# Patient Record
Sex: Male | Born: 1985 | Hispanic: Yes | Marital: Single | Smoking: Never smoker
Health system: Southern US, Community
[De-identification: ages and names within clinical notes are randomized; demographics above are authoritative.]

---

## 2011-05-15 ENCOUNTER — Other Ambulatory Visit: Payer: Self-pay | Admitting: Internal Medicine

## 2011-05-15 DIAGNOSIS — M545 Low back pain: Secondary | ICD-10-CM

## 2011-05-18 ENCOUNTER — Other Ambulatory Visit: Payer: Self-pay

## 2013-02-14 ENCOUNTER — Emergency Department (HOSPITAL_COMMUNITY)
Admission: EM | Admit: 2013-02-14 | Discharge: 2013-02-15 | Disposition: A | Discharge status: Home / Self Care | Payer: Self-pay | Attending: Emergency Medicine | Admitting: Emergency Medicine

## 2013-02-14 ENCOUNTER — Emergency Department (HOSPITAL_COMMUNITY): Payer: Self-pay

## 2013-02-14 ENCOUNTER — Encounter (HOSPITAL_COMMUNITY): Payer: Self-pay | Admitting: *Deleted

## 2013-02-14 DIAGNOSIS — S01501A Unspecified open wound of lip, initial encounter: Secondary | ICD-10-CM | POA: Insufficient documentation

## 2013-02-14 DIAGNOSIS — Z23 Encounter for immunization: Secondary | ICD-10-CM | POA: Insufficient documentation

## 2013-02-14 DIAGNOSIS — IMO0002 Reserved for concepts with insufficient information to code with codable children: Secondary | ICD-10-CM | POA: Insufficient documentation

## 2013-02-14 LAB — URINALYSIS, ROUTINE W REFLEX MICROSCOPIC
Glucose, UA: NEGATIVE mg/dL
Hgb urine dipstick: NEGATIVE
Leukocytes, UA: NEGATIVE
Specific Gravity, Urine: 1.029 (ref 1.005–1.030)
pH: 5.5 (ref 5.0–8.0)

## 2013-02-14 LAB — URINE MICROSCOPIC-ADD ON

## 2013-02-14 MED ORDER — TETANUS-DIPHTH-ACELL PERTUSSIS 5-2.5-18.5 LF-MCG/0.5 IM SUSP
0.5000 mL | Freq: Once | INTRAMUSCULAR | Status: AC
  Administered 2013-02-14: 0.5 mL via INTRAMUSCULAR
  Filled 2013-02-14: qty 0.5
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
## 2013-02-15 NOTE — ED Notes (Signed)
Pt dc to home.  Pt ambulatory to exit without difficulty.  Pt denies need for w/c.  

## 2013-02-15 NOTE — ED Provider Notes (Signed)
Medical screening examination/treatment/procedure(s) were performed by non-physician practitioner and as supervising physician I was immediately available for consultation/collaboration.  Willis Kuipers, MD 02/15/13 1502 

## 2013-02-20 ENCOUNTER — Encounter (HOSPITAL_COMMUNITY): Payer: Self-pay | Admitting: Cardiology

## 2013-02-20 ENCOUNTER — Emergency Department (HOSPITAL_COMMUNITY)
Admission: EM | Admit: 2013-02-20 | Discharge: 2013-02-20 | Disposition: A | Discharge status: Home / Self Care | Payer: Self-pay | Attending: Emergency Medicine | Admitting: Emergency Medicine

## 2013-02-20 DIAGNOSIS — Z4802 Encounter for removal of sutures: Secondary | ICD-10-CM | POA: Insufficient documentation

## 2013-02-20 NOTE — ED Provider Notes (Signed)
CSN: [id number]     Arrival date & time 02/20/13  0844 History   First MD Initiated Contact with Patient 02/20/13 [phone]     Chief Complaint  Patient presents with  . Suture / Staple Removal   (Consider location/radiation/quality/duration/timing/severity/associated sxs/prior Treatment) HPI Comments: 27 year old Hispanic male presents to the emergency department for suture removal. Patient had 5 sutures placed 6 days ago, 4 in his upper lip, one in his lower lip. Denies having any problems with the sutures. Denies redness, swelling or discharge. No fever or chills.  Patient is a 27 y.o. male presenting with suture removal. The history is provided by the patient.  Suture / Staple Removal    History reviewed. No pertinent past medical history. History reviewed. No pertinent past surgical history. History reviewed. No pertinent family history. History  Substance Use Topics  . Smoking status: Never Smoker   . Smokeless tobacco: Not on file  . Alcohol Use: Not on file    Review of Systems  Skin: Positive for wound.  All other systems reviewed and are negative.    Allergies  Review of patient's allergies indicates no known allergies.  Home Medications   Current Outpatient Rx  Name  Route  Sig  Dispense  Refill  . acetaminophen (TYLENOL) 500 MG tablet   Oral   Take 500 mg by mouth every 8 (eight) hours as needed for pain.          BP 118/68  Pulse 67  Temp(Src) 97.9 F (36.6 C) (Oral)  Resp 18  SpO2 97% Physical Exam  Nursing note and vitals reviewed. Constitutional: He is oriented to person, place, and time. He appears well-developed and well-nourished. No distress.  HENT:  Head: Normocephalic and atraumatic.  Mouth/Throat: Oropharynx is clear and moist.  Well-healed laceration to the upper and lower lip. 5 Sutures in place. No signs of infection.  Eyes: Conjunctivae are normal.  Neck: Normal range of motion. Neck supple.  Cardiovascular: Normal rate, regular rhythm  and normal heart sounds.   Pulmonary/Chest: Effort normal and breath sounds normal.  Musculoskeletal: Normal range of motion. He exhibits no edema.  Neurological: He is alert and oriented to person, place, and time.  Skin: Skin is warm and dry. He is not diaphoretic.  Psychiatric: He has a normal mood and affect. His behavior is normal.    ED Course  Procedures (including critical care time) SUTURE REMOVAL Performed by: Johnnette Gourd  Consent: Verbal consent obtained. Patient identity confirmed: provided demographic data Time out: Immediately prior to procedure a "time out" was called to verify the correct patient, procedure, equipment, support staff and site/side marked as required.  Location details: upper and lower lip  Wound Appearance: clean  Sutures/Staples Removed: 5  Facility: sutures placed in this facility Patient tolerance: Patient tolerated the procedure well with no immediate complications.    Labs Review Labs Reviewed - No data to display Imaging Review No results found.  MDM   1. Visit for suture removal         Trevor Mace, PA-C 02/20/13 1191

## 2013-02-20 NOTE — ED Notes (Signed)
Pt reports he needs to have his sutures removed. No other complaints at this time.

## 2013-02-21 NOTE — ED Provider Notes (Signed)
Medical screening examination/treatment/procedure(s) were performed by non-physician practitioner and as supervising physician I was immediately available for consultation/collaboration.  Cuong Moorman L Jlon Betker, MD 02/21/13 0955 
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
## 2015-08-06 ENCOUNTER — Ambulatory Visit (INDEPENDENT_AMBULATORY_CARE_PROVIDER_SITE_OTHER): Payer: Self-pay | Admitting: Urgent Care

## 2015-08-06 VITALS — BP 108/64 | HR 63 | Temp 98.7°F | Resp 12 | Ht 64.0 in | Wt 181.0 lb

## 2015-08-06 DIAGNOSIS — Z9109 Other allergy status, other than to drugs and biological substances: Secondary | ICD-10-CM

## 2015-08-06 DIAGNOSIS — Z91048 Other nonmedicinal substance allergy status: Secondary | ICD-10-CM

## 2015-08-06 DIAGNOSIS — J329 Chronic sinusitis, unspecified: Secondary | ICD-10-CM

## 2015-08-06 DIAGNOSIS — J309 Allergic rhinitis, unspecified: Secondary | ICD-10-CM

## 2015-08-06 DIAGNOSIS — R591 Generalized enlarged lymph nodes: Secondary | ICD-10-CM

## 2015-08-06 LAB — POCT CBC
Granulocyte percent: 70.7 %G (ref 37–80)
HCT, POC: 49.1 % (ref 43.5–53.7)
Hemoglobin: 17.6 g/dL (ref 14.1–18.1)
LYMPH, POC: 1.6 (ref 0.6–3.4)
MCH, POC: 31 pg (ref 27–31.2)
MCHC: 35.9 g/dL — AB (ref 31.8–35.4)
MCV: 86.5 fL (ref 80–97)
MID (CBC): 0.5 (ref 0–0.9)
MPV: 7.6 fL (ref 0–99.8)
PLATELET COUNT, POC: 187 10*3/uL (ref 142–424)
POC Granulocyte: 5.1 (ref 2–6.9)
POC LYMPH %: 22 % (ref 10–50)
POC MID %: 7.3 %M (ref 0–12)
RBC: 5.68 M/uL (ref 4.69–6.13)
RDW, POC: 12.2 %
WBC: 7.2 10*3/uL (ref 4.6–10.2)

## 2015-08-06 MED ORDER — PSEUDOEPHEDRINE HCL ER 120 MG PO TB12: 120.0000 mg | ORAL_TABLET | Freq: Two times a day (BID) | ORAL | Status: AC

## 2015-08-06 MED ORDER — AMOXICILLIN 875 MG PO TABS: 875.0000 mg | ORAL_TABLET | Freq: Two times a day (BID) | ORAL | Status: AC

## 2015-08-06 MED ORDER — CETIRIZINE HCL 10 MG PO TABS: 10.0000 mg | ORAL_TABLET | Freq: Every day | ORAL | Status: AC

## 2015-08-06 NOTE — Progress Notes (Signed)
    MRN: [id number] DOB: 04/21/1986  Subjective:   Dustin Morris is a 30 y.o. male presenting for chief complaint of Sore Throat; Nasal Congestion; Night Sweats; and Cough  Reports several month history of sore throat, bad taste in his mouth, chronic headaches, lymph node pain/swelling, chills, nasal congestion, intermittently productive cough. Has tried Tylenol which helps some but does not resolve the issue. Denies fever, weight loss, chest pain, shob. Denies smoking cigarettes or drinking alcohol. Patient has lived in [place] for ~10 years. Before then, he lived in [place]. Denies history of allergies or asthma.  Dustin Morris has a current medication list which includes the following prescription(s): acetaminophen. Also has No Known Allergies.  Dustin Morris  has no past medical history on file. Also  has no past surgical history on file.  His mother has diabetes. Denies family history of lung disease, heart disease.  Objective:   Vitals: BP 108/64 mmHg  Pulse 63  Temp(Src) 98.7 F (37.1 C) (Oral)  Resp 12  Ht  (1.626 m)  Wt 181 lb (82.101 kg)  BMI 31.05 kg/m2  SpO2 98%  Physical Exam  Constitutional: He is oriented to person, place, and time. He appears well-developed and well-nourished.  HENT:  TM's flat bilaterally, no effusions or erythema. Nasal turbinates erythematous and edematous bilaterally, L>R with bilateral maxillary sinus tenderness. Postnasal drip present, without oropharyngeal exudates, erythema or abscesses.  Eyes: Right eye exhibits no discharge. Left eye exhibits no discharge. No scleral icterus.  Neck: Normal range of motion. Neck supple.  Cardiovascular: Normal rate, regular rhythm and intact distal pulses.  Exam reveals no gallop and no friction rub.   No murmur heard. Pulmonary/Chest: No respiratory distress. He has no wheezes. He has no rales.  Lymphadenopathy:    He has no cervical adenopathy.  Neurological: He is alert and oriented to person, place, and time.    Skin: Skin is warm and dry. No rash noted.   Results for orders placed or performed in visit on 08/06/15 (from the past 24 hour(s))  POCT CBC     Status: Abnormal   Collection Time: 08/06/15  9:31 AM  Result Value Ref Range   WBC 7.2 4.6 - 10.2 K/uL   Lymph, poc 1.6 0.6 - 3.4   POC LYMPH PERCENT 22.0 10 - 50 %L   MID (cbc) 0.5 0 - 0.9   POC MID % 7.3 0 - 12 %M   POC Granulocyte 5.1 2 - 6.9   Granulocyte percent 70.7 37 - 80 %G   RBC 5.68 4.69 - 6.13 M/uL   Hemoglobin 17.6 14.1 - 18.1 g/dL   HCT, POC 08.6 57.8 - 53.7 %   MCV 86.5 80 - 97 fL   MCH, POC 31.0 27 - 31.2 pg   MCHC 35.9 (A) 31.8 - 35.4 g/dL   RDW, POC 46.9 %   Platelet Count, POC 187 142 - 424 K/uL   MPV 7.6 0 - 99.8 fL   Assessment and Plan :   1. Allergic rhinitis, unspecified allergic rhinitis type 2. Chronic sinusitis, unspecified location 3. Environmental allergies 4. Lymphadenopathy - Suspect chronic allergies that has led to sinusitis. Counseled patient on this, he will start Amoxicillin for chronic sinusitis. He will start Zyrtec daily for allergies, Sudafed as needed for post-nasal drainage.  Dustin Bamberg, PA-C [facility] [phone] 08/06/2015 9:00 AM

## 2015-08-06 NOTE — Patient Instructions (Signed)
Sinusitis en adultos (Sinusitis, Adult) La sinusitis es el enrojecimiento, el dolor y la inflamacin de los senos paranasales. Los senos paranasales son cavidades de aire que estn dentro de los huesos de la cara. Se encuentran debajo de los ojos, en la mitad de la frente y encima de los ojos. En los senos paranasales sanos, el moco puede drenar y el aire circula a travs de ellos en su camino hacia la nariz. Sin embargo, cuando se inflaman, el moco y el aire quedan atrapados. Esto hace que se desarrollen bacterias y otros grmenes que causan infeccin. La sinusitis puede desarrollarse rpidamente y durar solo un tiempo corto (aguda) o continuar por un perodo largo (crnica). La sinusitis que dura ms de 12 semanas se considera crnica. CAUSAS Las causas de la sinusitis son:  Alergias.  Las anomalas estructurales, como el desplazamiento del cartlago que separa las fosas nasales (desvo del tabique), que puede disminuir el flujo de aire por la nariz y los senos paranasales, y afectar su drenaje.  Las anomalas funcionales, como cuando los pequeos pelos (cilias) que se encuentran en los senos paranasales y ayudan a eliminar el moco no funcionan correctamente o no estn presentes. SIGNOS Y SNTOMAS Los sntomas de la sinusitis aguda y crnica son los mismos. Los sntomas principales son el dolor y la presin alrededor de los senos paranasales afectados. Otros sntomas son:  Dolor en los dientes superiores.  Dolor de odos.  Dolor de cabeza.  Mal aliento.  Disminucin del sentido del olfato y del gusto.  Tos, que empeora al acostarse.  Fatiga.  Fiebre.  Drenaje de moco espeso por la nariz, que generalmente es de color verde y puede contener pus (purulento).  Hinchazn y calor en los senos paranasales afectados. DIAGNSTICO Su mdico le realizar un examen fsico. Durante el examen, el mdico puede hacer cualquiera de estas cosas para determinar si usted tiene sinusitis aguda o  crnica:  Le revisar la nariz para buscar signos de crecimientos anormales en las fosas nasales (plipos nasales).  Palpar los senos paranasales afectados para buscar signos de infeccin.  Observar la parte interna de los senos paranasales con un dispositivo que tiene una luz (endoscopio). Si el mdico sospecha que usted sufre sinusitis crnica, podr indicar una o ms de las siguientes pruebas:  Pruebas de alergia.  Cultivo de las secreciones nasales. Se extrae una muestra de moco de la nariz, que se enva al laboratorio para detectar bacterias.  Citologa nasal. Se extrae una muestra de moco de la nariz, que el mdico examina para determinar si la sinusitis est relacionada con una alergia. TRATAMIENTO La mayora de los casos de sinusitis aguda se deben a una infeccin viral y se resuelven espontneamente en un perodo de 10das. A veces, se recetan medicamentos como ayuda para aliviar los sntomas tanto de la sinusitis aguda como de la crnica. Estos pueden incluir analgsicos, descongestivos, aerosoles nasales con corticoides o aerosoles de solucin salina. Sin embargo, para la sinusitis por infeccin bacteriana, el mdico le recetar antibiticos. Los antibiticos son medicamentos que destruyen las bacterias que causan la infeccin. Con poca frecuencia, la sinusitis tiene su origen en una infeccin por hongos. En estos casos, el mdico le recetar un medicamento antimictico. Para algunos casos de sinusitis crnica, es necesario someterse a una ciruga. Generalmente, se trata de casos en los que la sinusitis se repite ms de 3veces al ao, a pesar de otros tratamientos. INSTRUCCIONES PARA EL CUIDADO EN EL HOGAR  Beber abundante agua. Los lquidos ayudan a disolver   el moco, para que drene ms fcilmente de los senos paranasales.  Use un humidificador.  Inhale vapor de 3a 4veces al da (por ejemplo, sintese en el bao con la [place]).  Aplquese un pao tibio y hmedo en  la cara 3 o 4veces al da o segn las indicaciones del mdico.  Use un aerosol nasal salino para ayudar a Environmental education officer y [employer] senos nasales.  Tome los medicamentos solamente como se lo haya indicado el mdico.  Si le recetaron un antimictico o un antibitico, asegrese de terminarlos, incluso si comienza a sentirse mejor. SOLICITE ATENCIN MDICA DE INMEDIATO SI:  Siente ms dolor o sufre dolores de cabeza intensos.  Tiene nuseas, vmitos o somnolencia.  Observa hinchazn alrededor del rostro.  Tiene problemas de visin.  Presenta rigidez en el cuello.  Tiene dificultad para respirar.   Esta informacin no tiene Theme park manager el consejo del mdico. Asegrese de hacerle al mdico cualquier pregunta que tenga.   Document Released: 03/07/2005 Document Revised: 06/18/2014 Elsevier Interactive Patient Education 2016 [employer].    Rinitis alrgica (Allergic Rhinitis) La rinitis alrgica ocurre cuando las membranas mucosas de la nariz responden a los alrgenos. Los alrgenos son las partculas que estn en el aire y que hacen que el cuerpo tenga una reaccin Counselling psychologist. Esto hace que usted libere anticuerpos alrgicos. A travs de una cadena de eventos, estos finalmente hacen que usted libere histamina en la corriente sangunea. Aunque la funcin de la histamina es proteger al organismo, es esta liberacin de histamina lo que provoca malestar, como los estornudos frecuentes, la congestin y goteo y Control and instrumentation engineer.  CAUSAS La causa de la rinitis Merchandiser, retail (fiebre del heno) son los alrgenos del polen que pueden provenir del csped, los rboles y Theme park manager. La causa de la rinitis IT consultant (rinitis alrgica perenne) son los alrgenos, como los caros del polvo domstico, la caspa de las mascotas y las esporas del moho. SNTOMAS  Secrecin nasal (congestin).  Goteo y picazn nasales con estornudos y Arboriculturist. DIAGNSTICO Su mdico puede ayudarlo a  Warehouse manager alrgeno o los alrgenos que desencadenan sus sntomas. Si usted y su mdico no pueden Chief Strategy Officer cul es el alrgeno, pueden hacerse anlisis de sangre o estudios de la piel. El mdico diagnosticar la afeccin despus de hacerle una historia clnica y un examen fsico. Adems, puede evaluarlo para detectar la presencia de otras enfermedades afines, como asma, conjuntivitis u otitis. TRATAMIENTO La rinitis alrgica no tiene [place], pero puede controlarse con lo siguiente:  Medicamentos que [employer] sntomas de [place], por ejemplo, vacunas contra la [place], aerosoles nasales y antihistamnicos por va oral.  Evitar el alrgeno. La fiebre del heno a menudo puede tratarse con antihistamnicos en las formas de pldoras o aerosol nasal. Los antihistamnicos bloquean los efectos de la histamina. Existen medicamentos de venta libre que pueden ayudar con la congestin nasal y la hinchazn alrededor de los ojos. Consulte a su mdico antes de tomar o administrarse este medicamento. Si la prevencin del alrgeno o el medicamento recetado no dan resultado, existen muchos medicamentos nuevos que su mdico puede recetarle. Pueden usarse medicamentos ms fuertes si las medidas iniciales no son efectivas. Pueden aplicarse inyecciones desensibilizantes si los medicamentos y la prevencin no funcionan. La desensibilizacin ocurre cuando un paciente recibe vacunas constantes hasta que el cuerpo se vuelve menos sensible al alrgeno. Asegrese de Medical sales representative seguimiento con su mdico si los problemas continan. INSTRUCCIONES PARA EL CUIDADO EN EL HOGAR No es posible evitar por  completo los alrgenos, pero puede reducir los sntomas al tomar medidas para limitar su exposicin a ellos. Es muy til saber exactamente a qu es alrgico para que pueda evitar sus desencadenantes especficos. SOLICITE ATENCIN MDICA SI:  Lance Muss.  Desarrolla una tos que no cesa fcilmente (persistente).  Le falta el  aire.  Comienza a tener sibilancias.  Los sntomas interfieren con las actividades diarias normales.   Esta informacin no tiene Theme park manager el consejo del mdico. Asegrese de hacerle al mdico cualquier pregunta que tenga.   Document Released: 03/07/2005 Document Revised: 06/18/2014 Elsevier Interactive Patient Education 2016 [employer].    Lyda Perone (Allergies) Vella Raring es una reaccin anormal del sistema de defensa del cuerpo (sistema inmunitario) ante una sustancia. Las Oncologist a Actuary. CULES SON LAS CAUSAS DE LAS ALERGIAS? La reaccin alrgica se produce cuando el sistema inmunitario, por equivocacin, reacciona ante una sustancia normalmente inocua, llamada alrgeno, como si fuera perjudicial. El sistema inmunitario libera anticuerpos para combatir la sustancia. Con el tiempo, los anticuerpos liberan una sustancia qumica llamada histamina en el torrente sanguneo. La liberacin de histamina tiene como fin proteger al cuerpo de la infeccin, pero tambin causa molestias. Cualquiera de estas acciones puede desencadenar una reaccin alrgica:  Comer un alrgeno.  Inhalar un alrgeno.  Tocar un alrgeno. CULES SON LAS CLASES DE ALERGIAS? Hay muchas clases de alergias. Entre las ms frecuentes, se incluyen las siguientes:  Theatre manager. Por lo general, esta clase de alergia se produce por sustancias que solo estn presentes durante determinadas estaciones, por ejemplo, el moho y el polen.  Alergias a los alimentos.  Alergias a los medicamentos.  Alergias a los insectos.  Alergias a la caspa de [employer]. CULES SON LOS SNTOMAS DE LAS ALERGIAS? Entre los posibles sntomas de la [place], se incluyen los siguientes:  Hinchazn de los labios, la cara, la [place], la boca o la garganta.  Estornudos, tos o sibilancias.  Congestin nasal.  Hormigueo en la boca.  Erupcin cutnea.  Picazn.  Zonas de piel  hinchadas, rojas y que producen picazn (ronchas).  Lagrimeo.  Vmitos.  Diarrea.  Mareos.  Sensacin de desvanecimiento.  Desmayos.  Problemas para respirar o tragar.  Opresin en el pecho.  Latidos cardacos rpidos. CMO SE DIAGNOSTICAN LAS ALERGIAS? Las [employer] se diagnostican en funcin de los antecedentes mdicos y familiares, y mediante uno o ms de estos elementos:  Pruebas cutneas.  Anlisis de [place].  Un registro de alimentos. Un registro de [employer] todos los alimentos y las bebidas que usted consume en un da, y todos los sntomas que experimenta.  Los resultados de una dieta de eliminacin. Una dieta de eliminacin implica eliminar alimentos de la dieta y luego incorporarlos nuevamente, uno a la vez, para averiguar si hay uno en particular que le cause Runner, broadcasting/film/video. CMO SE TRATAN LAS ALERGIAS? No hay una cura para las [place], pero las reacciones alrgicas pueden tratarse con medicamentos. Generalmente, las reacciones graves deben tratarse en un hospital. CMO PUEDEN PREVENIRSE LAS REACCIONES? La mejor manera de prevenir una reaccin alrgica es evitar la sustancia que le causa alergia. Las vacunas y los medicamentos para la alergia tambin pueden ayudar a prevenir las reacciones en [employer]. Las personas con Therapist, art graves pueden prevenir una reaccin potencialmente mortal llamada anafilaxis con la administracin inmediata de un medicamento despus de la exposicin al alrgeno.   Esta informacin no tiene Theme park manager el consejo del mdico. Asegrese de hacerle al mdico  cualquier pregunta que tenga.   Document Released: 05/28/2005 Document Revised: 06/18/2014 Elsevier Interactive Patient Education [employer]
